# Patient Record
Sex: Male | Born: 1965 | Race: White | Hispanic: No | Marital: Married | Smoking: Former smoker
Health system: Southern US, Community
[De-identification: ages and names within clinical notes are randomized; demographics above are authoritative.]

## PROBLEM LIST (undated history)

## (undated) DIAGNOSIS — R7989 Other specified abnormal findings of blood chemistry: Secondary | ICD-10-CM

## (undated) DIAGNOSIS — E538 Deficiency of other specified B group vitamins: Secondary | ICD-10-CM

## (undated) DIAGNOSIS — G709 Myoneural disorder, unspecified: Secondary | ICD-10-CM

## (undated) HISTORY — PX: TONSILLECTOMY: SUR1361

---

## 2008-11-01 ENCOUNTER — Ambulatory Visit: Payer: Self-pay | Admitting: Urology

## 2010-10-18 ENCOUNTER — Ambulatory Visit: Payer: Self-pay | Admitting: Gastroenterology

## 2013-05-30 ENCOUNTER — Ambulatory Visit: Payer: Self-pay | Admitting: Physical Medicine and Rehabilitation

## 2015-02-28 ENCOUNTER — Other Ambulatory Visit: Payer: Self-pay | Admitting: Nurse Practitioner

## 2015-02-28 DIAGNOSIS — R109 Unspecified abdominal pain: Secondary | ICD-10-CM

## 2015-03-08 ENCOUNTER — Ambulatory Visit
Admission: RE | Admit: 2015-03-08 | Discharge: 2015-03-08 | Disposition: A | Discharge status: Home / Self Care | Payer: Commercial Managed Care - PPO | Source: Ambulatory Visit | Attending: Nurse Practitioner | Admitting: Nurse Practitioner

## 2015-03-08 DIAGNOSIS — N200 Calculus of kidney: Secondary | ICD-10-CM | POA: Diagnosis not present

## 2015-03-08 DIAGNOSIS — R109 Unspecified abdominal pain: Secondary | ICD-10-CM

## 2015-03-08 MED ORDER — IOHEXOL 300 MG/ML  SOLN
100.0000 mL | Freq: Once | INTRAMUSCULAR | Status: AC | PRN
  Administered 2015-03-08: 100 mL via INTRAVENOUS

## 2015-04-07 ENCOUNTER — Encounter: Payer: Self-pay | Admitting: *Deleted

## 2015-04-08 ENCOUNTER — Ambulatory Visit: Payer: Commercial Managed Care - PPO | Admitting: Anesthesiology

## 2015-04-08 ENCOUNTER — Encounter: Payer: Self-pay | Admitting: *Deleted

## 2015-04-08 ENCOUNTER — Encounter
Admission: RE | Disposition: A | Discharge status: Home Health | Payer: Self-pay | Source: Ambulatory Visit | Attending: Gastroenterology

## 2015-04-08 ENCOUNTER — Ambulatory Visit
Admission: RE | Admit: 2015-04-08 | Discharge: 2015-04-08 | Disposition: A | Discharge status: Home Health | Payer: Commercial Managed Care - PPO | Source: Ambulatory Visit | Attending: Gastroenterology | Admitting: Gastroenterology

## 2015-04-08 DIAGNOSIS — E538 Deficiency of other specified B group vitamins: Secondary | ICD-10-CM | POA: Insufficient documentation

## 2015-04-08 DIAGNOSIS — Z79890 Hormone replacement therapy (postmenopausal): Secondary | ICD-10-CM | POA: Insufficient documentation

## 2015-04-08 DIAGNOSIS — G8929 Other chronic pain: Secondary | ICD-10-CM | POA: Diagnosis not present

## 2015-04-08 DIAGNOSIS — N529 Male erectile dysfunction, unspecified: Secondary | ICD-10-CM | POA: Diagnosis not present

## 2015-04-08 DIAGNOSIS — Z791 Long term (current) use of non-steroidal anti-inflammatories (NSAID): Secondary | ICD-10-CM | POA: Insufficient documentation

## 2015-04-08 DIAGNOSIS — K625 Hemorrhage of anus and rectum: Secondary | ICD-10-CM | POA: Diagnosis not present

## 2015-04-08 DIAGNOSIS — G709 Myoneural disorder, unspecified: Secondary | ICD-10-CM | POA: Insufficient documentation

## 2015-04-08 DIAGNOSIS — K6289 Other specified diseases of anus and rectum: Secondary | ICD-10-CM | POA: Diagnosis not present

## 2015-04-08 DIAGNOSIS — R1030 Lower abdominal pain, unspecified: Secondary | ICD-10-CM | POA: Insufficient documentation

## 2015-04-08 DIAGNOSIS — Z87891 Personal history of nicotine dependence: Secondary | ICD-10-CM | POA: Diagnosis not present

## 2015-04-08 DIAGNOSIS — M5136 Other intervertebral disc degeneration, lumbar region: Secondary | ICD-10-CM | POA: Insufficient documentation

## 2015-04-08 DIAGNOSIS — M62838 Other muscle spasm: Secondary | ICD-10-CM | POA: Insufficient documentation

## 2015-04-08 DIAGNOSIS — Z79899 Other long term (current) drug therapy: Secondary | ICD-10-CM | POA: Insufficient documentation

## 2015-04-08 HISTORY — DX: Myoneural disorder, unspecified: G70.9

## 2015-04-08 HISTORY — DX: Deficiency of other specified B group vitamins: E53.8

## 2015-04-08 HISTORY — DX: Other specified abnormal findings of blood chemistry: R79.89

## 2015-04-08 HISTORY — PX: COLONOSCOPY WITH PROPOFOL: SHX5780

## 2015-04-08 SURGERY — COLONOSCOPY WITH PROPOFOL
Anesthesia: General

## 2015-04-08 MED ORDER — PROPOFOL INFUSION 10 MG/ML OPTIME
INTRAVENOUS | Status: DC | PRN
  Administered 2015-04-08: 150 ug/kg/min via INTRAVENOUS

## 2015-04-08 MED ORDER — SODIUM CHLORIDE 0.9 % IV SOLN: INTRAVENOUS | Status: DC

## 2015-04-08 MED ORDER — PROPOFOL 10 MG/ML IV BOLUS
INTRAVENOUS | Status: DC | PRN
Start: 2015-04-08 — End: 2015-04-08
  Administered 2015-04-08: 50 mg via INTRAVENOUS
  Administered 2015-04-08: 40 mg via INTRAVENOUS
  Administered 2015-04-08: 100 mg via INTRAVENOUS

## 2015-04-08 MED ORDER — LIDOCAINE HCL (CARDIAC) 10 MG/ML IV SOLN
INTRAVENOUS | Status: DC | PRN
  Administered 2015-04-08: 1 mL via INTRAVENOUS

## 2015-04-08 MED ORDER — SODIUM CHLORIDE 0.9 % IV SOLN
INTRAVENOUS | Status: DC
Start: 2015-04-08 — End: 2015-04-08
  Administered 2015-04-08: 1000 mL via INTRAVENOUS

## 2015-04-08 NOTE — Anesthesia Procedure Notes (Signed)
Date/Time: 04/08/2015 8:55 AM Performed by: Lenard Simmer Oxygen Delivery Method: Nasal cannula

## 2015-04-08 NOTE — Anesthesia Preprocedure Evaluation (Signed)
Anesthesia Evaluation  Patient identified by MRN, date of birth, ID band Patient awake    Reviewed: Allergy & Precautions, H&P , NPO status , Patient's Chart, lab work & pertinent test results, reviewed documented beta blocker date and time   History of Anesthesia Complications Negative for: history of anesthetic complications  Airway Mallampati: I  TM Distance: >3 FB Neck ROM: full    Dental no notable dental hx. 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) negative psych ROS   GI/Hepatic negative GI ROS, Neg liver ROS,   Endo/Other  negative endocrine ROS  Renal/GU negative Renal ROS  negative genitourinary   Musculoskeletal   Abdominal   Peds  Hematology negative hematology ROS (+)   Anesthesia Other Findings Past Medical History:   Neuromuscular disorder                                         Comment:Degenerative Disc Disease; Chronic Back Pain   Low testosterone                                             Vitamin B12 deficiency                                       Reproductive/Obstetrics negative OB ROS                             Anesthesia Physical Anesthesia Plan  ASA: II  Anesthesia Plan: General   Post-op Pain Management:    Induction:   Airway Management Planned:   Additional Equipment:   Intra-op Plan:   Post-operative Plan:   Informed Consent: I have reviewed the patients History and Physical, chart, labs and discussed the procedure including the risks, benefits and alternatives for the proposed anesthesia with the patient or authorized  representative who has indicated his/her understanding and acceptance.   Dental Advisory Given  Plan Discussed with: Anesthesiologist, CRNA and Surgeon  Anesthesia Plan Comments:         Anesthesia Quick Evaluation

## 2015-04-08 NOTE — Transfer of Care (Signed)
Immediate Anesthesia Transfer of Care Note  Patient: Cole Fletcher  Procedure(s) Performed: Procedure(s): COLONOSCOPY WITH PROPOFOL (N/A)  Patient Location: PACU and Endoscopy Unit  Anesthesia Type:General  Level of Consciousness: sedated  Airway & Oxygen Therapy: Patient Spontanous Breathing and Patient connected to nasal cannula oxygen  Post-op Assessment: Report given to RN and Post -op Vital signs reviewed and stable  Post vital signs: Reviewed and stable  Last Vitals:  Filed Vitals:   04/08/15 0801  BP: 129/69  Pulse: 78  Temp: 36.2 C  Resp: 22    Complications: No apparent anesthesia complications

## 2015-04-08 NOTE — Op Note (Signed)
[facility] Patient Name: Cole Fletcher Procedure Date: 04/08/2015 8:54 AM MRN: [id number] Account #: [id number] Date of Birth: 1966-02-21 Admit Type: Outpatient Age: 49 Room: [facility] ENDO ROOM 4 Gender: Male Note Status: Finalized Procedure:         Colonoscopy Indications:       Lower abdominal pain, Rectal bleeding, rectal bleeding. Providers:         Ezzard Standing. Bluford Kaufmann, MD Referring MD:      Daniel Nones, MD (Referring MD) Medicines:         Monitored Anesthesia Care Complications:     No immediate complications. Procedure:         Pre-Anesthesia Assessment:                    - Prior to the procedure, a History and Physical was                     performed, and patient medications, allergies and                     sensitivities were reviewed. The patient's tolerance of                     previous anesthesia was reviewed.                    - The risks and benefits of the procedure and the sedation                     options and risks were discussed with the patient. All                     questions were answered and informed consent was obtained.                    - After reviewing the risks and benefits, the patient was                     deemed in satisfactory condition to undergo the procedure.                    After obtaining informed consent, the colonoscope was                     passed under direct vision. Throughout the procedure, the                     patient's blood pressure, pulse, and oxygen saturations                     were monitored continuously. The Colonoscope was                     introduced through the anus and advanced to the the cecum,                     identified by appendiceal orifice and ileocecal valve. The                     colonoscopy was performed with difficulty due to                     significant looping. Successful completion of the  procedure was aided by changing the patient to a  prone                     position. The patient tolerated the procedure well. The                     quality of the bowel preparation was good. Findings:      The colon (entire examined portion) appeared normal. Impression:        - The entire examined colon is normal.                    - No specimens collected.                    - Mayhave IBS pain. Recommendation:    - Discharge patient to home.                    - Repeat colonoscopy in 10 years for surveillance.                    - The findings and recommendations were discussed with the                     patient.                    - CT of abdomen/pelvis if pain persists.                    - High fiber diet. Procedure Code(s): --- Professional ---                    [phone], Colonoscopy, flexible; diagnostic, including                     collection of specimen(s) by brushing or washing, when                     performed (separate procedure) Diagnosis Code(s): --- Professional ---                    R10.30, Lower abdominal pain, unspecified                    K62.5, Hemorrhage of anus and rectum CPT copyright 2014 [employer]. All rights reserved. The codes documented in this report are preliminary and upon coder review may  be revised to meet current compliance requirements. Wallace Cullens, MD 04/08/2015 9:10:31 AM This report has been signed electronically. Number of Addenda: 0 Note Initiated On: 04/08/2015 8:54 AM Scope Withdrawal Time: 0 hours 3 minutes 1 second  Total Procedure Duration: 0 hours 9 minutes 2 seconds       [facility]

## 2015-04-08 NOTE — H&P (Signed)
    Primary Care Physician:  Lynnea Ferrier, MD Primary Gastroenterologist:  Dr. Bluford Kaufmann  Pre-Procedure History & Physical: HPI:  Cole Fletcher is a 49 y.o. male is here for an colonoscopy.   Past Medical History  Diagnosis Date  . Neuromuscular disorder     Degenerative Disc Disease; Chronic Back Pain  . Low testosterone   . Vitamin B12 deficiency     Past Surgical History  Procedure Laterality Date  . Tonsillectomy      Prior to Admission medications   Medication Sig Start Date End Date Taking? Authorizing Provider  Cholecalciferol 1000 UNITS TBDP Take 1,000 Units by mouth daily.   Yes Historical Provider, MD  cyanocobalamin 500 MCG tablet Take 500 mcg by mouth daily.   Yes Historical Provider, MD  cyclobenzaprine (FLEXERIL) 10 MG tablet Take 10 mg by mouth 3 (three) times daily as needed for muscle spasms.   Yes Historical Provider, MD  docusate sodium (COLACE) 100 MG capsule Take 100 mg by mouth daily.   Yes Historical Provider, MD  FIBER, CORN DEXTRIN, PO Take 1 tablet by mouth daily.   Yes Historical Provider, MD  Hydrocodone-Acetaminophen 10-300 MG TABS Take 1 tablet by mouth every 6 (six) hours as needed.   Yes Historical Provider, MD  naproxen (NAPROSYN) 375 MG tablet Take 375 mg by mouth 2 (two) times daily with a meal.   Yes Historical Provider, MD  sildenafil (VIAGRA) 50 MG tablet Take 50 mg by mouth as needed for erectile dysfunction.   Yes Historical Provider, MD  Testosterone (ANDROGEL) 25 MG/2.5GM (1%) GEL Place 1 packet onto the skin daily as needed. Apply to the upper arms and/or abdomen.   Yes Historical Provider, MD    Allergies as of 03/09/2015  . (No Known Allergies)    History reviewed. No pertinent family history.  Social History   Social History  . Marital Status: Married    Spouse Name: N/A  . Number of Children: N/A  . Years of Education: N/A   Occupational History  . Not on file.   Social History Main Topics  . Smoking status: Former  Games developer  . Smokeless tobacco: Former Neurosurgeon    Types: Snuff  . Alcohol Use: No  . Drug Use: No  . Sexual Activity: Not on file   Other Topics Concern  . Not on file   Social History Narrative    Review of Systems: See HPI, otherwise negative ROS  Physical Exam: BP 129/69 mmHg  Pulse 78  Temp(Src) 97.1 F (36.2 C) (Tympanic)  Resp 22  Ht  (1.727 m)  Wt 81.647 kg (180 lb)  BMI 27.38 kg/m2  SpO2 100% General:   Alert,  pleasant and cooperative in NAD Head:  Normocephalic and atraumatic. Neck:  Supple; no masses or thyromegaly. Lungs:  Clear throughout to auscultation.    Heart:  Regular rate and rhythm. Abdomen:  Soft, nontender and nondistended. Normal bowel sounds, without guarding, and without rebound.   Neurologic:  Alert and  oriented x4;  grossly normal neurologically.  Impression/Plan: Cole Fletcher is here for an colonoscopy to be performed for rectal bleeding and rectal pain. Risks, benefits, limitations, and alternatives regarding  Colonoscopy have been reviewed with the patient.  Questions have been answered.  All parties agreeable.   Latisha Lasch, Ezzard Standing, MD  04/08/2015, 8:02 AM

## 2015-04-08 NOTE — Anesthesia Postprocedure Evaluation (Signed)
  Anesthesia Post-op Note  Patient: Cole Fletcher  Procedure(s) Performed: Procedure(s): COLONOSCOPY WITH PROPOFOL (N/A)  Anesthesia type:General  Patient location: PACU  Post pain: Pain level controlled  Post assessment: Post-op Vital signs reviewed, Patient's Cardiovascular Status Stable, Respiratory Function Stable, Patent Airway and No signs of Nausea or vomiting  Post vital signs: Reviewed and stable  Last Vitals:  Filed Vitals:   04/08/15 0943  BP: 123/83  Pulse: 67  Temp:   Resp: 16    Level of consciousness: awake, alert  and patient cooperative  Complications: No apparent anesthesia complications

## 2015-04-09 ENCOUNTER — Encounter: Payer: Self-pay | Admitting: Gastroenterology

## 2017-04-11 ENCOUNTER — Other Ambulatory Visit: Payer: Self-pay | Admitting: Internal Medicine

## 2017-04-11 DIAGNOSIS — R1011 Right upper quadrant pain: Secondary | ICD-10-CM

## 2017-04-16 ENCOUNTER — Ambulatory Visit
Admission: RE | Admit: 2017-04-16 | Discharge: 2017-04-16 | Disposition: A | Discharge status: Home / Self Care | Payer: Commercial Managed Care - PPO | Source: Ambulatory Visit | Attending: Internal Medicine | Admitting: Internal Medicine

## 2017-04-16 DIAGNOSIS — R1011 Right upper quadrant pain: Secondary | ICD-10-CM | POA: Diagnosis present

## 2017-04-18 ENCOUNTER — Other Ambulatory Visit: Payer: Self-pay | Admitting: Internal Medicine

## 2017-04-18 DIAGNOSIS — R1011 Right upper quadrant pain: Secondary | ICD-10-CM

## 2017-05-08 ENCOUNTER — Encounter
Admission: RE | Admit: 2017-05-08 | Discharge: 2017-05-08 | Disposition: A | Discharge status: Home / Self Care | Payer: Commercial Managed Care - PPO | Source: Ambulatory Visit | Attending: Internal Medicine | Admitting: Internal Medicine

## 2017-05-08 DIAGNOSIS — R1011 Right upper quadrant pain: Secondary | ICD-10-CM | POA: Diagnosis present

## 2017-05-08 MED ORDER — TECHNETIUM TC 99M MEBROFENIN IV KIT
5.2600 | PACK | Freq: Once | INTRAVENOUS | Status: AC | PRN
  Administered 2017-05-08: 5.26 via INTRAVENOUS

## 2017-06-10 ENCOUNTER — Other Ambulatory Visit: Payer: Self-pay | Admitting: Internal Medicine

## 2017-06-12 ENCOUNTER — Other Ambulatory Visit: Payer: Self-pay | Admitting: Internal Medicine

## 2017-06-12 DIAGNOSIS — R1011 Right upper quadrant pain: Secondary | ICD-10-CM

## 2017-06-21 ENCOUNTER — Ambulatory Visit
Admission: RE | Admit: 2017-06-21 | Discharge: 2017-06-21 | Disposition: A | Discharge status: Home / Self Care | Payer: Commercial Managed Care - PPO | Source: Ambulatory Visit | Attending: Internal Medicine | Admitting: Internal Medicine

## 2017-06-21 DIAGNOSIS — R1011 Right upper quadrant pain: Secondary | ICD-10-CM

## 2017-06-21 DIAGNOSIS — K760 Fatty (change of) liver, not elsewhere classified: Secondary | ICD-10-CM | POA: Diagnosis not present

## 2017-06-21 MED ORDER — IOPAMIDOL (ISOVUE-300) INJECTION 61%
100.0000 mL | Freq: Once | INTRAVENOUS | Status: AC | PRN
  Administered 2017-06-21: 100 mL via INTRAVENOUS

## 2018-02-11 ENCOUNTER — Other Ambulatory Visit
Admission: RE | Admit: 2018-02-11 | Discharge: 2018-02-11 | Disposition: A | Discharge status: Home / Self Care | Payer: Commercial Managed Care - PPO | Source: Ambulatory Visit | Attending: Gastroenterology | Admitting: Gastroenterology

## 2018-02-11 DIAGNOSIS — R1013 Epigastric pain: Secondary | ICD-10-CM | POA: Insufficient documentation

## 2018-02-12 LAB — H. PYLORI ANTIGEN, STOOL: H. Pylori Stool Ag, Eia: NEGATIVE

## 2018-05-04 IMAGING — NM NM HEPATO W/GB/PHARM/[PERSON_NAME]
2 series · 12 of 12 positions shown · non-contrast
Comparison: None.

CLINICAL DATA: Chronic right upper quadrant pain for approximately
1 year.

EXAM:
NUCLEAR MEDICINE HEPATOBILIARY IMAGING WITH GALLBLADDER EF
TECHNIQUE: Sequential images of the abdomen were obtained [DATE] minutes
following intravenous administration of radiopharmaceutical. After
oral ingestion of Ensure, gallbladder ejection fraction was
determined. At 60 min, normal ejection fraction is greater than 33%.
RADIOPHARMACEUTICALS:  5.3 mCi 6c-IIm  Choletec IV

[Series 1000: gallbladder ef · 4.80mm/px · 6 of 120 frames shown]
[frame 11/120]
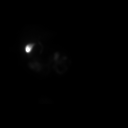
[frame 31/120]
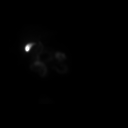
[frame 51/120]
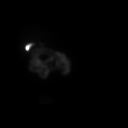
[frame 71/120]
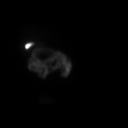
[frame 91/120]
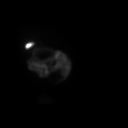
[frame 111/120]
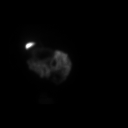

[Series 1000: hepatobiliary scan · 9.59mm/px · 6 of 60 frames shown]
[frame 6/60]
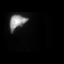
[frame 16/60]
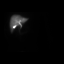
[frame 26/60]
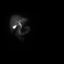
[frame 36/60]
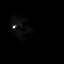
[frame 46/60]
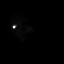
[frame 56/60]
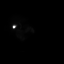

[12 of 12 positions shown; findings below may reference images not displayed]

FINDINGS: Prompt uptake and biliary excretion of activity by the liver is
seen. Gallbladder activity is visualized, consistent with patency of
cystic duct. Biliary activity passes into small bowel, consistent
with patent common bile duct.

Calculated gallbladder ejection fraction is 64%. (Normal gallbladder
ejection fraction with Ensure is greater than 33%.)
IMPRESSION: Normal hepatobiliary scan demonstrating patency of both cystic and
common bile ducts.

Normal gallbladder ejection fraction.

## 2020-11-03 ENCOUNTER — Other Ambulatory Visit
Admission: RE | Admit: 2020-11-03 | Discharge: 2020-11-03 | Disposition: A | Discharge status: Home / Self Care | Payer: Commercial Managed Care - PPO | Source: Ambulatory Visit | Attending: Internal Medicine | Admitting: Internal Medicine

## 2020-11-03 DIAGNOSIS — R079 Chest pain, unspecified: Secondary | ICD-10-CM | POA: Insufficient documentation

## 2020-11-03 LAB — D-DIMER, QUANTITATIVE: D-Dimer, Quant: 0.28 ug/mL-FEU (ref 0.00–0.50)

## 2020-11-03 LAB — TROPONIN I (HIGH SENSITIVITY): Troponin I (High Sensitivity): 9 ng/L (ref <18)

## 2023-02-21 ENCOUNTER — Other Ambulatory Visit: Payer: Self-pay | Admitting: Internal Medicine

## 2023-02-21 DIAGNOSIS — E785 Hyperlipidemia, unspecified: Secondary | ICD-10-CM

## 2023-02-21 DIAGNOSIS — Z0000 Encounter for general adult medical examination without abnormal findings: Secondary | ICD-10-CM

## 2023-02-27 ENCOUNTER — Inpatient Hospital Stay: Admission: RE | Admit: 2023-02-27 | Payer: Commercial Managed Care - PPO | Source: Ambulatory Visit

## 2023-02-28 ENCOUNTER — Ambulatory Visit
Admission: RE | Admit: 2023-02-28 | Discharge: 2023-02-28 | Disposition: A | Discharge status: Home / Self Care | Payer: Commercial Managed Care - PPO | Source: Ambulatory Visit | Attending: Internal Medicine | Admitting: Internal Medicine

## 2023-02-28 DIAGNOSIS — Z0000 Encounter for general adult medical examination without abnormal findings: Secondary | ICD-10-CM | POA: Insufficient documentation

## 2023-02-28 DIAGNOSIS — E785 Hyperlipidemia, unspecified: Secondary | ICD-10-CM | POA: Insufficient documentation

## 2023-05-27 ENCOUNTER — Other Ambulatory Visit: Payer: Self-pay | Admitting: Otolaryngology

## 2023-05-27 DIAGNOSIS — H9191 Unspecified hearing loss, right ear: Secondary | ICD-10-CM

## 2023-05-27 DIAGNOSIS — H9311 Tinnitus, right ear: Secondary | ICD-10-CM

## 2023-06-13 ENCOUNTER — Ambulatory Visit
Admission: RE | Admit: 2023-06-13 | Discharge: 2023-06-13 | Disposition: A | Discharge status: Home / Self Care | Payer: Commercial Managed Care - PPO | Source: Ambulatory Visit | Attending: Otolaryngology | Admitting: Otolaryngology

## 2023-06-13 DIAGNOSIS — H9191 Unspecified hearing loss, right ear: Secondary | ICD-10-CM

## 2023-06-13 DIAGNOSIS — H9311 Tinnitus, right ear: Secondary | ICD-10-CM

## 2023-06-13 MED ORDER — GADOPICLENOL 0.5 MMOL/ML IV SOLN
10.0000 mL | Freq: Once | INTRAVENOUS | Status: AC | PRN
  Administered 2023-06-13: 8 mL via INTRAVENOUS

## 2023-10-01 ENCOUNTER — Other Ambulatory Visit: Payer: Self-pay | Admitting: Internal Medicine

## 2023-10-01 DIAGNOSIS — R1084 Generalized abdominal pain: Secondary | ICD-10-CM

## 2023-10-10 ENCOUNTER — Ambulatory Visit
Admission: RE | Admit: 2023-10-10 | Discharge: 2023-10-10 | Disposition: A | Discharge status: Home / Self Care | Source: Ambulatory Visit | Attending: Internal Medicine | Admitting: Internal Medicine

## 2023-10-10 DIAGNOSIS — R1084 Generalized abdominal pain: Secondary | ICD-10-CM | POA: Insufficient documentation

## 2024-08-03 ENCOUNTER — Other Ambulatory Visit: Payer: Self-pay | Admitting: Otolaryngology

## 2024-08-03 DIAGNOSIS — H93299 Other abnormal auditory perceptions, unspecified ear: Secondary | ICD-10-CM

## 2024-08-13 ENCOUNTER — Other Ambulatory Visit

## 2024-08-21 ENCOUNTER — Ambulatory Visit
Admission: RE | Admit: 2024-08-21 | Discharge: 2024-08-21 | Disposition: A | Source: Ambulatory Visit | Attending: Otolaryngology | Admitting: Otolaryngology

## 2024-08-21 DIAGNOSIS — H93299 Other abnormal auditory perceptions, unspecified ear: Secondary | ICD-10-CM
